# Patient Record
Sex: Female | Born: 1980 | Race: White | Hispanic: No | Marital: Married | Smoking: Never smoker
Health system: Southern US, Community
[De-identification: ages and names within clinical notes are randomized; demographics above are authoritative.]

## PROBLEM LIST (undated history)

## (undated) DIAGNOSIS — I951 Orthostatic hypotension: Secondary | ICD-10-CM

## (undated) DIAGNOSIS — T360X5A Adverse effect of penicillins, initial encounter: Secondary | ICD-10-CM

## (undated) DIAGNOSIS — G90A Postural orthostatic tachycardia syndrome (POTS): Secondary | ICD-10-CM

## (undated) DIAGNOSIS — G629 Polyneuropathy, unspecified: Secondary | ICD-10-CM

## (undated) DIAGNOSIS — D8940 Mast cell activation, unspecified: Secondary | ICD-10-CM

## (undated) DIAGNOSIS — E282 Polycystic ovarian syndrome: Secondary | ICD-10-CM

## (undated) DIAGNOSIS — G43909 Migraine, unspecified, not intractable, without status migrainosus: Secondary | ICD-10-CM

## (undated) DIAGNOSIS — I498 Other specified cardiac arrhythmias: Secondary | ICD-10-CM

---

## 2020-04-20 ENCOUNTER — Emergency Department: Payer: Medicare HMO

## 2020-04-20 ENCOUNTER — Other Ambulatory Visit: Payer: Self-pay

## 2020-04-20 DIAGNOSIS — Z91040 Latex allergy status: Secondary | ICD-10-CM | POA: Insufficient documentation

## 2020-04-20 DIAGNOSIS — Z20822 Contact with and (suspected) exposure to covid-19: Secondary | ICD-10-CM | POA: Insufficient documentation

## 2020-04-20 DIAGNOSIS — Z79899 Other long term (current) drug therapy: Secondary | ICD-10-CM | POA: Insufficient documentation

## 2020-04-20 DIAGNOSIS — M549 Dorsalgia, unspecified: Secondary | ICD-10-CM

## 2020-04-20 DIAGNOSIS — R1013 Epigastric pain: Secondary | ICD-10-CM | POA: Diagnosis present

## 2020-04-20 DIAGNOSIS — K8012 Calculus of gallbladder with acute and chronic cholecystitis without obstruction: Secondary | ICD-10-CM | POA: Diagnosis not present

## 2020-04-20 DIAGNOSIS — R109 Unspecified abdominal pain: Secondary | ICD-10-CM

## 2020-04-20 LAB — COMPREHENSIVE METABOLIC PANEL
ALT: 23 U/L (ref 0–44)
AST: 19 U/L (ref 15–41)
Albumin: 4.4 g/dL (ref 3.5–5.0)
Alkaline Phosphatase: 80 U/L (ref 38–126)
Anion gap: 10 (ref 5–15)
BUN: 9 mg/dL (ref 6–20)
CO2: 25 mmol/L (ref 22–32)
Calcium: 9.4 mg/dL (ref 8.9–10.3)
Chloride: 101 mmol/L (ref 98–111)
Creatinine, Ser: 0.64 mg/dL (ref 0.44–1.00)
GFR, Estimated: >60 mL/min (ref >60)
Glucose, Bld: 129 mg/dL — ABNORMAL HIGH (ref 70–99)
Potassium: 3.6 mmol/L (ref 3.5–5.1)
Sodium: 136 mmol/L (ref 135–145)
Total Bilirubin: 0.5 mg/dL (ref 0.3–1.2)
Total Protein: 7.6 g/dL (ref 6.5–8.1)

## 2020-04-20 LAB — URINALYSIS, COMPLETE (UACMP) WITH MICROSCOPIC
Bacteria, UA: NONE SEEN
Bilirubin Urine: NEGATIVE
Glucose, UA: NEGATIVE mg/dL
Hgb urine dipstick: NEGATIVE
Ketones, ur: NEGATIVE mg/dL
Leukocytes,Ua: NEGATIVE
Nitrite: NEGATIVE
Protein, ur: NEGATIVE mg/dL
Specific Gravity, Urine: 1.016 (ref 1.005–1.030)
pH: 5 (ref 5.0–8.0)

## 2020-04-20 LAB — CBC
HCT: 42.1 % (ref 36.0–46.0)
Hemoglobin: 13.7 g/dL (ref 12.0–15.0)
MCH: 29.3 pg (ref 26.0–34.0)
MCHC: 32.5 g/dL (ref 30.0–36.0)
MCV: 90 fL (ref 80.0–100.0)
Platelets: 362 10*3/uL (ref 150–400)
RBC: 4.68 MIL/uL (ref 3.87–5.11)
RDW: 12.1 % (ref 11.5–15.5)
WBC: 14.3 10*3/uL — ABNORMAL HIGH (ref 4.0–10.5)
nRBC: 0 % (ref 0.0–0.2)

## 2020-04-20 LAB — LIPASE, BLOOD: Lipase: 23 U/L (ref 11–51)

## 2020-04-20 LAB — PREGNANCY, URINE: Preg Test, Ur: NEGATIVE

## 2020-04-20 MED ORDER — OXYCODONE-ACETAMINOPHEN 5-325 MG PO TABS
1.0000 | ORAL_TABLET | Freq: Once | ORAL | Status: AC
  Administered 2020-04-20: 1 via ORAL
  Filled 2020-04-20: qty 1

## 2020-04-20 MED ORDER — ONDANSETRON 4 MG PO TBDP
4.0000 mg | ORAL_TABLET | Freq: Once | ORAL | Status: AC
  Administered 2020-04-20: 4 mg via ORAL
  Filled 2020-04-20: qty 1

## 2020-04-20 NOTE — ED Triage Notes (Signed)
Pt states aprox 2 hours ago she started having back pain that has now moved to her abdomen. Pt states she had 2 episodes of emesis. Denies diarrhea.

## 2020-04-21 ENCOUNTER — Encounter: Payer: Self-pay | Admitting: Radiology

## 2020-04-21 ENCOUNTER — Encounter
Admission: EM | Disposition: A | Discharge status: Home / Self Care | Payer: Self-pay | Source: Home / Self Care | Attending: Emergency Medicine

## 2020-04-21 ENCOUNTER — Observation Stay
Admission: EM | Admit: 2020-04-21 | Discharge: 2020-04-21 | Disposition: A | Discharge status: Home / Self Care | Payer: Medicare HMO | Attending: General Surgery | Admitting: General Surgery

## 2020-04-21 ENCOUNTER — Emergency Department: Payer: Medicare HMO

## 2020-04-21 ENCOUNTER — Observation Stay: Payer: Medicare HMO | Admitting: Anesthesiology

## 2020-04-21 DIAGNOSIS — M549 Dorsalgia, unspecified: Secondary | ICD-10-CM

## 2020-04-21 DIAGNOSIS — K810 Acute cholecystitis: Secondary | ICD-10-CM | POA: Diagnosis present

## 2020-04-21 DIAGNOSIS — R109 Unspecified abdominal pain: Secondary | ICD-10-CM

## 2020-04-21 HISTORY — DX: Polyneuropathy, unspecified: G62.9

## 2020-04-21 HISTORY — DX: Postural orthostatic tachycardia syndrome (POTS): G90.A

## 2020-04-21 HISTORY — DX: Mast cell activation, unspecified: D89.40

## 2020-04-21 HISTORY — DX: Adverse effect of penicillins, initial encounter: T36.0X5A

## 2020-04-21 HISTORY — DX: Polycystic ovarian syndrome: E28.2

## 2020-04-21 HISTORY — DX: Other specified cardiac arrhythmias: I49.8

## 2020-04-21 HISTORY — DX: Migraine, unspecified, not intractable, without status migrainosus: G43.909

## 2020-04-21 HISTORY — DX: Orthostatic hypotension: I95.1

## 2020-04-21 LAB — RESP PANEL BY RT-PCR (FLU A&B, COVID) ARPGX2
Influenza A by PCR: NEGATIVE
Influenza B by PCR: NEGATIVE
SARS Coronavirus 2 by RT PCR: NEGATIVE

## 2020-04-21 SURGERY — CHOLECYSTECTOMY, ROBOT-ASSISTED, LAPAROSCOPIC
Anesthesia: General | Site: Abdomen

## 2020-04-21 MED ORDER — FENTANYL CITRATE (PF) 100 MCG/2ML IJ SOLN
INTRAMUSCULAR | Status: DC | PRN
  Administered 2020-04-21 (×2): 50 ug via INTRAVENOUS

## 2020-04-21 MED ORDER — ONDANSETRON HCL 4 MG/2ML IJ SOLN: 4.0000 mg | Freq: Four times a day (QID) | INTRAMUSCULAR | Status: DC | PRN

## 2020-04-21 MED ORDER — SUGAMMADEX SODIUM 200 MG/2ML IV SOLN
INTRAVENOUS | Status: DC | PRN
  Administered 2020-04-21: 200 mg via INTRAVENOUS

## 2020-04-21 MED ORDER — BUPIVACAINE-EPINEPHRINE 0.25% -1:200000 IJ SOLN
INTRAMUSCULAR | Status: DC | PRN
  Administered 2020-04-21: 17 mL

## 2020-04-21 MED ORDER — PANTOPRAZOLE SODIUM 40 MG IV SOLR
40.0000 mg | Freq: Every day | INTRAVENOUS | Status: DC
  Filled 2020-04-21: qty 40

## 2020-04-21 MED ORDER — HYDROCODONE-ACETAMINOPHEN 5-325 MG PO TABS
ORAL_TABLET | ORAL | Status: AC
  Filled 2020-04-21: qty 2

## 2020-04-21 MED ORDER — ONDANSETRON HCL 4 MG/2ML IJ SOLN
INTRAMUSCULAR | Status: DC | PRN
  Administered 2020-04-21: 4 mg via INTRAVENOUS

## 2020-04-21 MED ORDER — ACETAMINOPHEN 10 MG/ML IV SOLN
INTRAVENOUS | Status: DC | PRN
  Administered 2020-04-21: 1000 mg via INTRAVENOUS

## 2020-04-21 MED ORDER — DEXAMETHASONE SODIUM PHOSPHATE 10 MG/ML IJ SOLN
INTRAMUSCULAR | Status: DC | PRN
  Administered 2020-04-21: 10 mg via INTRAVENOUS

## 2020-04-21 MED ORDER — ONDANSETRON HCL 4 MG/2ML IJ SOLN
4.0000 mg | Freq: Once | INTRAMUSCULAR | Status: AC
  Administered 2020-04-21: 4 mg via INTRAVENOUS
  Filled 2020-04-21: qty 2

## 2020-04-21 MED ORDER — ROCURONIUM BROMIDE 100 MG/10ML IV SOLN
INTRAVENOUS | Status: DC | PRN
  Administered 2020-04-21: 50 mg via INTRAVENOUS

## 2020-04-21 MED ORDER — PYRIDOSTIGMINE BROMIDE 60 MG PO TABS
60.0000 mg | ORAL_TABLET | Freq: Three times a day (TID) | ORAL | Status: DC
  Filled 2020-04-21 (×2): qty 1

## 2020-04-21 MED ORDER — IOHEXOL 300 MG/ML  SOLN
125.0000 mL | Freq: Once | INTRAMUSCULAR | Status: AC | PRN
  Administered 2020-04-21: 125 mL via INTRAVENOUS

## 2020-04-21 MED ORDER — ENOXAPARIN SODIUM 60 MG/0.6ML ~~LOC~~ SOLN
0.5000 mg/kg | SUBCUTANEOUS | Status: DC
  Filled 2020-04-21: qty 0.6

## 2020-04-21 MED ORDER — FENTANYL CITRATE (PF) 100 MCG/2ML IJ SOLN
INTRAMUSCULAR | Status: AC
  Administered 2020-04-21: 25 ug via INTRAVENOUS
  Filled 2020-04-21: qty 2

## 2020-04-21 MED ORDER — DEXMEDETOMIDINE HCL 200 MCG/2ML IV SOLN
INTRAVENOUS | Status: DC | PRN
  Administered 2020-04-21 (×2): 8 ug via INTRAVENOUS
  Administered 2020-04-21: 4 ug via INTRAVENOUS

## 2020-04-21 MED ORDER — DEXMEDETOMIDINE (PRECEDEX) IN NS 20 MCG/5ML (4 MCG/ML) IV SYRINGE
PREFILLED_SYRINGE | INTRAVENOUS | Status: AC
  Filled 2020-04-21: qty 5

## 2020-04-21 MED ORDER — HYDROMORPHONE HCL 1 MG/ML IJ SOLN
INTRAMUSCULAR | Status: AC
  Administered 2020-04-21: 0.5 mg via INTRAVENOUS
  Filled 2020-04-21: qty 1

## 2020-04-21 MED ORDER — SODIUM CHLORIDE 0.9 % IV SOLN
1.0000 g | Freq: Once | INTRAVENOUS | Status: AC
  Administered 2020-04-21: 1 g via INTRAVENOUS
  Filled 2020-04-21: qty 10

## 2020-04-21 MED ORDER — HYDROMORPHONE HCL 1 MG/ML IJ SOLN
0.5000 mg | INTRAMUSCULAR | Status: DC | PRN
  Administered 2020-04-21: 0.5 mg via INTRAVENOUS

## 2020-04-21 MED ORDER — ONDANSETRON 4 MG PO TBDP: 4.0000 mg | ORAL_TABLET | Freq: Four times a day (QID) | ORAL | Status: DC | PRN

## 2020-04-21 MED ORDER — HYDROCODONE-ACETAMINOPHEN 5-325 MG PO TABS: 1.0000 | ORAL_TABLET | ORAL | 0 refills | Status: AC | PRN

## 2020-04-21 MED ORDER — BUPIVACAINE-EPINEPHRINE (PF) 0.25% -1:200000 IJ SOLN
INTRAMUSCULAR | Status: AC
  Filled 2020-04-21: qty 30

## 2020-04-21 MED ORDER — SODIUM CHLORIDE FLUSH 0.9 % IV SOLN
INTRAVENOUS | Status: AC
  Filled 2020-04-21: qty 10

## 2020-04-21 MED ORDER — ACETAMINOPHEN 10 MG/ML IV SOLN
INTRAVENOUS | Status: AC
  Filled 2020-04-21: qty 100

## 2020-04-21 MED ORDER — MIDAZOLAM HCL 2 MG/2ML IJ SOLN
INTRAMUSCULAR | Status: DC | PRN
  Administered 2020-04-21: 2 mg via INTRAVENOUS

## 2020-04-21 MED ORDER — ONDANSETRON HCL 4 MG/2ML IJ SOLN
INTRAMUSCULAR | Status: AC
  Filled 2020-04-21: qty 2

## 2020-04-21 MED ORDER — PROMETHAZINE HCL 25 MG/ML IJ SOLN
INTRAMUSCULAR | Status: AC
  Administered 2020-04-21: 6.25 mg via INTRAVENOUS
  Filled 2020-04-21: qty 1

## 2020-04-21 MED ORDER — ACETAMINOPHEN 325 MG PO TABS: 650.0000 mg | ORAL_TABLET | Freq: Four times a day (QID) | ORAL | Status: DC | PRN

## 2020-04-21 MED ORDER — INDOCYANINE GREEN 25 MG IV SOLR
1.2500 mg | Freq: Once | INTRAVENOUS | Status: AC
  Administered 2020-04-21: 1.25 mg via INTRAVENOUS
  Filled 2020-04-21: qty 0.5

## 2020-04-21 MED ORDER — ACETAMINOPHEN 650 MG RE SUPP: 650.0000 mg | Freq: Four times a day (QID) | RECTAL | Status: DC | PRN

## 2020-04-21 MED ORDER — METRONIDAZOLE IN NACL 5-0.79 MG/ML-% IV SOLN
500.0000 mg | Freq: Once | INTRAVENOUS | Status: AC
  Administered 2020-04-21: 500 mg via INTRAVENOUS
  Filled 2020-04-21: qty 100

## 2020-04-21 MED ORDER — CIPROFLOXACIN IN D5W 400 MG/200ML IV SOLN: 400.0000 mg | Freq: Two times a day (BID) | INTRAVENOUS | Status: DC

## 2020-04-21 MED ORDER — MORPHINE SULFATE (PF) 4 MG/ML IV SOLN
4.0000 mg | INTRAVENOUS | Status: DC | PRN
Start: 2020-04-21 — End: 2020-04-22

## 2020-04-21 MED ORDER — ONDANSETRON HCL 4 MG/2ML IJ SOLN
4.0000 mg | Freq: Once | INTRAMUSCULAR | Status: AC | PRN
  Administered 2020-04-21: 4 mg via INTRAVENOUS

## 2020-04-21 MED ORDER — PHENYLEPHRINE HCL (PRESSORS) 10 MG/ML IV SOLN
INTRAVENOUS | Status: DC | PRN
  Administered 2020-04-21 (×2): 100 ug via INTRAVENOUS

## 2020-04-21 MED ORDER — LIDOCAINE HCL (CARDIAC) PF 100 MG/5ML IV SOSY
PREFILLED_SYRINGE | INTRAVENOUS | Status: DC | PRN
  Administered 2020-04-21: 100 mg via INTRAVENOUS

## 2020-04-21 MED ORDER — MIDAZOLAM HCL 2 MG/2ML IJ SOLN
INTRAMUSCULAR | Status: AC
  Filled 2020-04-21: qty 2

## 2020-04-21 MED ORDER — PROPOFOL 10 MG/ML IV BOLUS
INTRAVENOUS | Status: AC
  Filled 2020-04-21: qty 20

## 2020-04-21 MED ORDER — PROMETHAZINE HCL 25 MG/ML IJ SOLN: 6.2500 mg | INTRAMUSCULAR | Status: DC | PRN

## 2020-04-21 MED ORDER — FENTANYL CITRATE (PF) 100 MCG/2ML IJ SOLN
50.0000 ug | Freq: Once | INTRAMUSCULAR | Status: AC
  Administered 2020-04-21: 50 ug via INTRAVENOUS
  Filled 2020-04-21: qty 2

## 2020-04-21 MED ORDER — LACTATED RINGERS IV SOLN: INTRAVENOUS | Status: DC | PRN

## 2020-04-21 MED ORDER — PROPOFOL 10 MG/ML IV BOLUS
INTRAVENOUS | Status: DC | PRN
  Administered 2020-04-21: 170 mg via INTRAVENOUS

## 2020-04-21 MED ORDER — KETOROLAC TROMETHAMINE 30 MG/ML IJ SOLN
15.0000 mg | Freq: Once | INTRAMUSCULAR | Status: AC
  Administered 2020-04-21: 15 mg via INTRAVENOUS
  Filled 2020-04-21: qty 1

## 2020-04-21 MED ORDER — ENOXAPARIN SODIUM 40 MG/0.4ML ~~LOC~~ SOLN: 40.0000 mg | SUBCUTANEOUS | Status: DC

## 2020-04-21 MED ORDER — FENTANYL CITRATE (PF) 100 MCG/2ML IJ SOLN
INTRAMUSCULAR | Status: AC
  Filled 2020-04-21: qty 2

## 2020-04-21 MED ORDER — HYDROCODONE-ACETAMINOPHEN 5-325 MG PO TABS
1.0000 | ORAL_TABLET | ORAL | Status: DC | PRN
Start: 2020-04-21 — End: 2020-04-22
  Administered 2020-04-21: 2 via ORAL

## 2020-04-21 MED ORDER — VERAPAMIL HCL ER 180 MG PO TBCR
180.0000 mg | EXTENDED_RELEASE_TABLET | Freq: Every day | ORAL | Status: DC
  Filled 2020-04-21: qty 1

## 2020-04-21 MED ORDER — FENTANYL CITRATE (PF) 100 MCG/2ML IJ SOLN
25.0000 ug | INTRAMUSCULAR | Status: AC | PRN
Start: 2020-04-21 — End: 2020-04-21
  Administered 2020-04-21 (×6): 25 ug via INTRAVENOUS

## 2020-04-21 MED ORDER — SODIUM CHLORIDE 0.9 % IV BOLUS
500.0000 mL | Freq: Once | INTRAVENOUS | Status: AC
  Administered 2020-04-21: 500 mL via INTRAVENOUS

## 2020-04-21 SURGICAL SUPPLY — 50 items
BAG INFUSER PRESSURE 100CC (MISCELLANEOUS) IMPLANT
BLADE SURG SZ11 CARB STEEL (BLADE) ×2 IMPLANT
CANISTER SUCT 1200ML W/VALVE (MISCELLANEOUS) IMPLANT
CANNULA REDUC XI 12-8 STAPL (CANNULA) ×1
CANNULA REDUCER 12-8 DVNC XI (CANNULA) ×1 IMPLANT
CHLORAPREP W/TINT 26 (MISCELLANEOUS) ×2 IMPLANT
CLIP VESOLOCK MED LG 6/CT (CLIP) ×2 IMPLANT
COVER WAND RF STERILE (DRAPES) ×2 IMPLANT
DECANTER SPIKE VIAL GLASS SM (MISCELLANEOUS) IMPLANT
DEFOGGER SCOPE WARMER CLEARIFY (MISCELLANEOUS) ×2 IMPLANT
DERMABOND ADVANCED (GAUZE/BANDAGES/DRESSINGS) ×1
DERMABOND ADVANCED .7 DNX12 (GAUZE/BANDAGES/DRESSINGS) ×1 IMPLANT
DRAPE ARM DVNC X/XI (DISPOSABLE) ×4 IMPLANT
DRAPE COLUMN DVNC XI (DISPOSABLE) ×1 IMPLANT
DRAPE DA VINCI XI ARM (DISPOSABLE) ×4
DRAPE DA VINCI XI COLUMN (DISPOSABLE) ×1
ELECT REM PT RETURN 9FT ADLT (ELECTROSURGICAL) ×2
ELECTRODE REM PT RTRN 9FT ADLT (ELECTROSURGICAL) ×1 IMPLANT
GLOVE SURG ENC MOIS LTX SZ6.5 (GLOVE) ×4 IMPLANT
GLOVE SURG UNDER POLY LF SZ6.5 (GLOVE) ×4 IMPLANT
GOWN STRL REUS W/ TWL LRG LVL3 (GOWN DISPOSABLE) ×3 IMPLANT
GOWN STRL REUS W/TWL LRG LVL3 (GOWN DISPOSABLE) ×3
GRASPER SUT TROCAR 14GX15 (MISCELLANEOUS) ×2 IMPLANT
IRRIGATOR SUCT 8 DISP DVNC XI (IRRIGATION / IRRIGATOR) IMPLANT
IRRIGATOR SUCTION 8MM XI DISP (IRRIGATION / IRRIGATOR)
IV NS 1000ML (IV SOLUTION)
IV NS 1000ML BAXH (IV SOLUTION) IMPLANT
KIT PINK PAD W/HEAD ARE REST (MISCELLANEOUS) ×2
KIT PINK PAD W/HEAD ARM REST (MISCELLANEOUS) ×1 IMPLANT
LABEL OR SOLS (LABEL) ×2 IMPLANT
MANIFOLD NEPTUNE II (INSTRUMENTS) IMPLANT
NEEDLE HYPO 22GX1.5 SAFETY (NEEDLE) ×2 IMPLANT
NEEDLE INSUFFLATION 14GA 120MM (NEEDLE) ×2 IMPLANT
NS IRRIG 500ML POUR BTL (IV SOLUTION) ×2 IMPLANT
OBTURATOR OPTICAL STANDARD 8MM (TROCAR) ×1
OBTURATOR OPTICAL STND 8 DVNC (TROCAR) ×1
OBTURATOR OPTICALSTD 8 DVNC (TROCAR) ×1 IMPLANT
PACK LAP CHOLECYSTECTOMY (MISCELLANEOUS) ×2 IMPLANT
POUCH SPECIMEN RETRIEVAL 10MM (ENDOMECHANICALS) ×2 IMPLANT
SEAL CANN UNIV 5-8 DVNC XI (MISCELLANEOUS) ×3 IMPLANT
SEAL XI 5MM-8MM UNIVERSAL (MISCELLANEOUS) ×3
SET TUBE SMOKE EVAC HIGH FLOW (TUBING) ×2 IMPLANT
SOLUTION ELECTROLUBE (MISCELLANEOUS) ×2 IMPLANT
SPONGE LAP 4X18 RFD (DISPOSABLE) ×2 IMPLANT
STAPLER CANNULA SEAL DVNC XI (STAPLE) ×1 IMPLANT
STAPLER CANNULA SEAL XI (STAPLE) ×1
SUT MNCRL 4-0 (SUTURE) ×2
SUT MNCRL 4-0 27XMFL (SUTURE) ×2
SUT VICRYL 0 AB UR-6 (SUTURE) ×2 IMPLANT
SUTURE MNCRL 4-0 27XMF (SUTURE) ×2 IMPLANT

## 2020-04-21 NOTE — Transfer of Care (Signed)
Immediate Anesthesia Transfer of Care Note  Patient: Samantha Baird  Procedure(s) Performed: XI ROBOTIC ASSISTED LAPAROSCOPIC CHOLECYSTECTOMY (N/A Abdomen)  Patient Location: PACU  Anesthesia Type:General  Level of Consciousness: drowsy  Airway & Oxygen Therapy: Patient Spontanous Breathing and Patient connected to face mask oxygen  Post-op Assessment: Report given to RN and Post -op Vital signs reviewed and stable  Post vital signs: Reviewed and stable  Last Vitals:  Vitals Value Taken Time  BP    Temp    Pulse 101 04/21/20 1727  Resp 22 04/21/20 1727  SpO2 97 % 04/21/20 1727  Vitals shown include unvalidated device data.  Last Pain:  Vitals:   04/21/20 1330  TempSrc: Oral  PainSc: 4          Complications: No complications documented.

## 2020-04-21 NOTE — ED Notes (Signed)
ED Provider at bedside. 

## 2020-04-21 NOTE — Anesthesia Procedure Notes (Signed)
Procedure Name: Intubation Date/Time: 04/21/2020 3:58 PM Performed by: Hezzie Bump, CRNA Pre-anesthesia Checklist: Patient identified, Patient being monitored, Timeout performed, Emergency Drugs available and Suction available Patient Re-evaluated:Patient Re-evaluated prior to induction Oxygen Delivery Method: Circle system utilized Preoxygenation: Pre-oxygenation with 100% oxygen Induction Type: IV induction Ventilation: Mask ventilation without difficulty Laryngoscope Size: 3 and McGraph Grade View: Grade I Tube type: Oral Tube size: 7.0 mm Number of attempts: 1 Airway Equipment and Method: Stylet Placement Confirmation: ETT inserted through vocal cords under direct vision,  positive ETCO2 and breath sounds checked- equal and bilateral Secured at: 21 cm Tube secured with: Tape Dental Injury: Teeth and Oropharynx as per pre-operative assessment

## 2020-04-21 NOTE — Discharge Instructions (Signed)
  Diet: Resume home heart healthy regular diet.   Activity: No heavy lifting >20 pounds (children, pets, laundry, garbage) or strenuous activity until follow-up, but light activity and walking are encouraged. Do not drive or drink alcohol if taking narcotic pain medications.  Wound care: May shower with soapy water and pat dry (do not rub incisions), but no baths or submerging incision underwater until follow-up. (no swimming)   Medications: Resume all home medications. For mild to moderate pain: acetaminophen (Tylenol) or ibuprofen (if no kidney disease). Combining Tylenol with alcohol can substantially increase your risk of causing liver disease. Narcotic pain medications, if prescribed, can be used for severe pain, though may cause nausea, constipation, and drowsiness. Do not combine Tylenol and Norco within a 6 hour period as Norco contains Tylenol. If you do not need the narcotic pain medication, you do not need to fill the prescription.  Call office ([phone]) at any time if any questions, worsening pain, fevers/chills, bleeding, drainage from incision site, or other concerns.   AMBULATORY SURGERY  DISCHARGE INSTRUCTIONS   1) The drugs that you were given will stay in your system until tomorrow so for the next 24 hours you should not:  A) Drive an automobile B) Make any legal decisions C) Drink any alcoholic beverage   2) You may resume regular meals tomorrow.  Today it is better to start with liquids and gradually work up to solid foods.  You may eat anything you prefer, but it is better to start with liquids, then soup and crackers, and gradually work up to solid foods.   3) Please notify your doctor immediately if you have any unusual bleeding, trouble breathing, redness and pain at the surgery site, drainage, fever, or pain not relieved by medication.  4) Your post-operative visit with Dr.                                     is: Date:                        Time:     Please call to schedule your post-operative visit.  5) Additional Instructions:   

## 2020-04-21 NOTE — Anesthesia Postprocedure Evaluation (Signed)
Anesthesia Post Note  Patient: Samantha Baird  Procedure(s) Performed: XI ROBOTIC ASSISTED LAPAROSCOPIC CHOLECYSTECTOMY (N/A Abdomen)  Patient location during evaluation: PACU Anesthesia Type: General Level of consciousness: awake and alert Pain management: pain level controlled Vital Signs Assessment: post-procedure vital signs reviewed and stable Respiratory status: spontaneous breathing, nonlabored ventilation, respiratory function stable and patient connected to nasal cannula oxygen Cardiovascular status: blood pressure returned to baseline and stable Postop Assessment: no apparent nausea or vomiting Anesthetic complications: no   No complications documented.   Last Vitals:  Vitals:   04/21/20 1830 04/21/20 1845  BP: 130/77 (!) 108/58  Pulse: 80 87  Resp: 12 19  Temp:    SpO2: 93% 97%    Last Pain:  Vitals:   04/21/20 1921  TempSrc:   PainSc: 5                  Cleda Mccreedy Ambera Fedele

## 2020-04-21 NOTE — ED Provider Notes (Signed)
[facility] - Inpatient Emergency Department Provider Note  ____________________________________________  Time seen: Approximately 2:57 AM  I have reviewed the triage vital signs and the nursing notes.   HISTORY  Chief Complaint Back Pain and Abdominal Pain   HPI Samantha Baird is a 40 y.o. female with a history of PCOS, chronic back pain, mast cell activation syndrome, migraine, POTS who presents for evaluation of abdominal pain.  Pain started 2 hours prior to arrival.  Initially was epigastric but then became diffuse across her abdomen and radiating to her back.  The pain exacerbates her chronic low back pain.  She has had nausea and 3 episodes of nonbloody nonbilious emesis.  The pain initially was 9 out of 10 but after receiving oxycodone in triage her pain now is moderate in intensity.  The pain has been constant for several hours.  No prior abdominal surgeries or similar pain.  No diarrhea or constipation.  She did notice a foul-smelling urine couple days ago but this seems to have resolved.  She denies dysuria or hematuria, history of UTI/pyelonephritis/kidney stones.  She denies any history of STDs and reports being with the same partner for the last 10 years, denies any vaginal discharge.   Past Medical History:  Diagnosis Date  . Mast cell activation syndrome (HCC)   . Migraine   . Orthostatic hypotension   . PCOS (polycystic ovarian syndrome)   . Penicillin adverse reaction   . POTS (postural orthostatic tachycardia syndrome)   . Small fiber neuropathy     There are no problems to display for this patient.   History reviewed. No pertinent surgical history.  Prior to Admission medications   Not on File    Allergies Latex and Penicillins  No family history on file.  Social History Social History   Tobacco Use  . Smoking status: Never Smoker  . Smokeless tobacco: Never Used  Vaping Use  . Vaping Use: Never used  Substance Use Topics  . Alcohol  use: Not Currently  . Drug use: Never    Review of Systems  Constitutional: Negative for fever. Eyes: Negative for visual changes. ENT: Negative for sore throat. Neck: No neck pain  Cardiovascular: Negative for chest pain. Respiratory: Negative for shortness of breath. Gastrointestinal: + diffuse abdominal pain, nausea, and vomiting. No diarrhea. Genitourinary: Negative for dysuria. Musculoskeletal: Negative for back pain. Skin: Negative for rash. Neurological: Negative for headaches, weakness or numbness. Psych: No SI or HI  ____________________________________________   PHYSICAL EXAM:  VITAL SIGNS: ED Triage Vitals  Enc Vitals Group     BP 04/20/20 2145 129/78     Pulse Rate 04/20/20 2145 83     Resp 04/20/20 2145 18     Temp 04/20/20 2145 (!) 97.5 F (36.4 C)     Temp Source 04/20/20 2145 Oral     SpO2 04/20/20 2145 99 %     Weight 04/20/20 2140 230 lb (104.3 kg)     Height 04/20/20 2140 5\' 2"  (1.575 m)     Head Circumference --      Peak Flow --      Pain Score 04/20/20 2140 8     Pain Loc --      Pain Edu? --      Excl. in GC? --     Constitutional: Alert and oriented. Well appearing and in no apparent distress. HEENT:      Head: Normocephalic and atraumatic.         Eyes: Conjunctivae are  normal. Sclera is non-icteric.       Mouth/Throat: Mucous membranes are moist.       Neck: Supple with no signs of meningismus. Cardiovascular: Regular rate and rhythm. No murmurs, gallops, or rubs. 2+ symmetrical distal pulses are present in all extremities. No JVD. Respiratory: Normal respiratory effort. Lungs are clear to auscultation bilaterally.  Gastrointestinal: Soft, tender to palpation on the RLQ, RUQ, and suprapubic regions, negative Murphy's sign, non distended with positive bowel sounds. No rebound or guarding. Genitourinary: No CVA tenderness. Musculoskeletal:  No edema, cyanosis, or erythema of extremities. Neurologic: Normal speech and language. Face is  symmetric. Moving all extremities. No gross focal neurologic deficits are appreciated. Skin: Skin is warm, dry and intact. No rash noted. Psychiatric: Mood and affect are normal. Speech and behavior are normal.  ____________________________________________   LABS (all labs ordered are listed, but only abnormal results are displayed)  Labs Reviewed  COMPREHENSIVE METABOLIC PANEL - Abnormal; Notable for the following components:      Result Value   Glucose, Bld 129 (*)    All other components within normal limits  CBC - Abnormal; Notable for the following components:   WBC 14.3 (*)    All other components within normal limits  URINALYSIS, COMPLETE (UACMP) WITH MICROSCOPIC - Abnormal; Notable for the following components:   Color, Urine YELLOW (*)    APPearance HAZY (*)    All other components within normal limits  LIPASE, BLOOD  PREGNANCY, URINE   ____________________________________________  EKG  none  ____________________________________________  RADIOLOGY  I have personally reviewed the images performed during this visit and I agree with the Radiologist's read.   Interpretation by Radiologist:  CT ABDOMEN PELVIS W CONTRAST  Result Date: 04/21/2020 CLINICAL DATA:  Unspecified abdominal pain EXAM: CT ABDOMEN AND PELVIS WITH CONTRAST TECHNIQUE: Multidetector CT imaging of the abdomen and pelvis was performed using the standard protocol following bolus administration of intravenous contrast. CONTRAST:  OMNIPAQUE IOHEXOL 300 MG/ML  SOLN COMPARISON:  None. FINDINGS: Lower chest: The visualized lung bases are clear bilaterally. The visualized heart and pericardium are unremarkable Hepatobiliary: Cholelithiasis. There is subtle pericholecystic inflammatory stranding best seen in the region of the gallbladder neck on coronal and sagittal imaging most in keeping with changes of early acute cholecystitis. The gallbladder is not distended and demonstrates uniform enhancement. No  intra or extrahepatic biliary ductal dilation. Liver is unremarkable. Pancreas: Unremarkable. No pancreatic ductal dilatation or surrounding inflammatory changes. Spleen: Unremarkable Adrenals/Urinary Tract: Adrenal glands are unremarkable. Kidneys are normal, without renal calculi, focal lesion, or hydronephrosis. Bladder is unremarkable. Stomach/Bowel: Stomach is within normal limits. Appendix appears normal. No evidence of bowel wall thickening, distention, or inflammatory changes. No free intraperitoneal gas or fluid. Vascular/Lymphatic: No significant vascular findings are present. No enlarged abdominal or pelvic lymph nodes. Reproductive: Intrauterine device seen within the uterine cavity in expected position. The pelvic organs are otherwise unremarkable. Other: Tiny fat containing umbilical hernia.  Rectum unremarkable. Musculoskeletal: No acute bone abnormality. No lytic or blastic bone lesion. IMPRESSION: Cholelithiasis with subtle pericholecystic inflammatory changes in keeping with early, acute cholecystitis. Electronically Signed   By: Helyn Numbers MD   On: 04/21/2020 03:56   US PELVIC COMPLETE W TRANSVAGINAL AND TORSION R/O  Result Date: 04/21/2020 CLINICAL DATA:  Initial evaluation for acute lower abdominal pain with back pain. EXAM: TRANSABDOMINAL AND TRANSVAGINAL ULTRASOUND OF PELVIS DOPPLER ULTRASOUND OF OVARIES TECHNIQUE: Both transabdominal and transvaginal ultrasound examinations of the pelvis were performed. Transabdominal technique was performed for  global imaging of the pelvis including uterus, ovaries, adnexal regions, and pelvic cul-de-sac. It was necessary to proceed with endovaginal exam following the transabdominal exam to visualize the uterus, endometrium, and ovaries. Color and duplex Doppler ultrasound was utilized to evaluate blood flow to the ovaries. COMPARISON:  None. FINDINGS: Uterus Measurements: 6.0 x 2.5 x 3.8 cm = volume: 29.4 mL. Uterus is anteverted. 1.4 x 0.9 x 1.2 cm  subserosal fibroid present at the right uterine body. Endometrium Thickness: 3.3 mm. No focal abnormality visualized. IUD in appropriate position within the medial cavity. Small volume simple fluid noted within the endocervical canal. Right ovary Measurements: 3.1 x 1.6 x 1.7 cm = volume: 2.7 mL. Normal appearance/no adnexal mass. Left ovary Measurements: 4.7 x 2.0 x 2.5 cm = volume: 12.4 mL. 2.3 x 1.6 x 1.9 cm complex cyst with internal lace-like architecture, most characteristic of a benign hemorrhagic cyst. Pulsed Doppler evaluation of both ovaries demonstrates normal low-resistance arterial and venous waveforms. Other findings Trace free fluid seen within the pelvis. IMPRESSION: 1. 2.3 cm complex left ovarian cyst, most characteristic of a benign hemorrhagic cyst. 2. No evidence for ovarian torsion. 3. IUD in appropriate position within the endometrial cavity. 4. 1.4 cm subserosal fibroid at the right uterine body. 5. No other acute abnormality. Electronically Signed   By: Rise Mu M.D.   On: 04/21/2020 01:02     ____________________________________________   PROCEDURES  Procedure(s) performed: None Procedures Critical Care performed:  None ____________________________________________   INITIAL IMPRESSION / ASSESSMENT AND PLAN / ED COURSE  40 y.o. female with a history of PCOS, chronic back pain, mast cell activation syndrome, migraine, POTS who presents for evaluation of abdominal pain.  Patient is well-appearing in no distress with normal vital signs, abdomen is soft with mild tenderness to palpation the RLQ, RUQ, and suprapubic regions with no rebound or guarding.  No distention and positive bowel sounds.    Ddx UTI, pyelonephritis, kidney stone, appendicitis, diverticulitis, GB pathology, gastritis, gastroenteritis, pregnancy, ectopic, ovarian torsion, PID, STD.   Labs showing elevated white count of 14.3 which according to the patient is chronic for her.  She reports that  her white count is always elevated.  She has normal LFTs and lipase, her urine shows no signs of an infection or blood, pregnancy test is negative.  Mildly elevated blood glucose of 129 which was discussed with patient.  No history of diabetes.  Patient was sent for an ultrasound from the waiting room which shows a 2.3 cm left hemorrhagic ovarian cyst but since patient is mostly tender on the right lower quadrant with an elevated white count we will pursue a CT.  Will give a dose of IV Toradol for pain.  _________________________ 7:19 AM on 04/21/2020 -----------------------------------------  CT concerning for possible borderline/early acute cholecystitis.  Recommending ultrasound of the right upper quadrant which has been done.  Unfortunately radiology system PACS is down images are not available to be evaluated by me or the radiologist at this time.  Patient was complaining of recurrent pain and received a dose of IV fentanyl.  Plan to reassess once the results of the ultrasound are available and consult surgery.  Care transferred to Dr. Roxan Hockey.    _____________________________________________ Please note:  Patient was evaluated in Emergency Department today for the symptoms described in the history of present illness. Patient was evaluated in the context of the global COVID-19 pandemic, which necessitated consideration that the patient might be at risk for infection with the SARS-CoV-2 virus  that causes COVID-19. Institutional protocols and algorithms that pertain to the evaluation of patients at risk for COVID-19 are in a state of rapid change based on information released by regulatory bodies including the CDC and federal and state organizations. These policies and algorithms were followed during the patient's care in the ED.  Some ED evaluations and interventions may be delayed as a result of limited staffing during the pandemic.   [place] Controlled Substance Database was reviewed by  me. ____________________________________________   FINAL CLINICAL IMPRESSION(S) / ED DIAGNOSES   Final diagnoses:  Back pain  Abdominal pain      NEW MEDICATIONS STARTED DURING THIS VISIT:  ED Discharge Orders    None       Note:  This document was prepared using Dragon voice recognition software and may include unintentional dictation errors.    Don PerkingVeronese, [place], MD 04/21/20 [phone]

## 2020-04-21 NOTE — H&P (Signed)
SURGICAL CONSULTATION NOTE   HISTORY OF PRESENT ILLNESS (HPI):  40 y.o. female presented to [facility] ED for evaluation of abdominal pain since last night.  Patient reports started with pain on the epigastric area after eating. Pain radiated to her back. Pain aggravated by eating. Alleviating factor was IV pain medications.   At the ED she was found with leukocytosis. CT of the abdomen showed changes of cholecystitis. Abdominal ultrasound shows sludge. I personally evaluated the images  Surgery is consulted by Dr. Roxan Hockey in this context for evaluation and management of acute cholecystitis.  PAST MEDICAL HISTORY (PMH):  Past Medical History:  Diagnosis Date  . Mast cell activation syndrome (HCC)   . Migraine   . Orthostatic hypotension   . PCOS (polycystic ovarian syndrome)   . Penicillin adverse reaction   . POTS (postural orthostatic tachycardia syndrome)   . Small fiber neuropathy      PAST SURGICAL HISTORY (PSH):  History reviewed. No pertinent surgical history.   MEDICATIONS:  Prior to Admission medications   Medication Sig Start Date End Date Taking? Authorizing Provider  Betahistine HCl POWD 24 mg by Does not apply route 2 (two) times daily.   Yes [provider]  cholecalciferol (VITAMIN D3) 25 MCG (1000 UNIT) tablet Take 2,000 Units by mouth 3 (three) times a week.   Yes [provider]  diphenhydrAMINE (BENADRYL) 25 mg capsule Take 25 mg by mouth every 6 (six) hours as needed.   Yes [provider]  Erenumab-aooe (AIMOVIG) 140 MG/ML SOAJ Inject into the skin as needed.   Yes [provider]  famotidine (PEPCID) 20 MG tablet Take 20 mg by mouth daily.   Yes [provider]  fexofenadine (ALLEGRA) 180 MG tablet Take 180 mg by mouth daily.   Yes [provider]  gabapentin (NEURONTIN) 800 MG tablet Take 800 mg by mouth 2 (two) times daily.   Yes [provider]  lamoTRIgine (LAMICTAL) 100 MG tablet Take 100 mg by  mouth at bedtime.   Yes [provider]  midodrine (PROAMATINE) 10 MG tablet Take 10 mg by mouth 3 (three) times daily.   Yes [provider]  modafinil (PROVIGIL) 200 MG tablet Take 200 mg by mouth daily. 12/24/19 12/23/20 Yes [provider]  Multiple Vitamin (MULTIVITAMIN WITH MINERALS) TABS tablet Take 1 tablet by mouth daily.   Yes [provider]  NALTREXONE HCL PO Take 4.5 mg by mouth in the morning and at bedtime.   Yes [provider]  ondansetron (ZOFRAN) 8 MG tablet Take 8 mg by mouth every 8 (eight) hours as needed for nausea or vomiting.   Yes [provider]  pyridostigmine (MESTINON) 60 MG tablet Take 60 mg by mouth 3 (three) times daily. 04/05/20  Yes [provider]  tizanidine (ZANAFLEX) 2 MG capsule Take 2 mg by mouth 3 (three) times daily as needed for muscle spasms.   Yes [provider]  verapamil (CALAN) 40 MG tablet Take 40 mg by mouth daily. lunch   Yes [provider]  verapamil (CALAN-SR) 180 MG CR tablet Take 180 mg by mouth at bedtime. 03/23/20  Yes [provider]     ALLERGIES:  Allergies  Allergen Reactions  . Amitriptyline Other (See Comments)    HALLUCINATIONS  . Latex   . Penicillins Hives     SOCIAL HISTORY:  Social History   Socioeconomic History  . Marital status: Married    Spouse name: Not on file  .  Number of children: Not on file  . Years of education: Not on file  . Highest education level: Not on file  Occupational History  . Not on file  Tobacco Use  . Smoking status: Never Smoker  . Smokeless tobacco: Never Used  Vaping Use  . Vaping Use: Never used  Substance and Sexual Activity  . Alcohol use: Not Currently  . Drug use: Never  . Sexual activity: Not on file  Other Topics Concern  . Not on file  Social History Narrative  . Not on file   Social Determinants of Health   Financial Resource Strain: Not on file  Food Insecurity: Not on file   Transportation Needs: Not on file  Physical Activity: Not on file  Stress: Not on file  Social Connections: Not on file  Intimate Partner Violence: Not on file     FAMILY HISTORY:  No family history on file.   REVIEW OF SYSTEMS:  Constitutional: denies weight loss, fever, chills, or sweats  Eyes: denies any other vision changes, history of eye injury  ENT: denies sore throat, hearing problems  Respiratory: denies shortness of breath, wheezing  Cardiovascular: denies chest pain, palpitations  Gastrointestinal: positive abdominal pain, Denies nausea and vomitnig Genitourinary: denies burning with urination or urinary frequency Musculoskeletal: denies any other joint pains or cramps  Skin: denies any other rashes or skin discolorations  Neurological: denies any other headache, dizziness, weakness  Psychiatric: denies any other depression, anxiety   All other review of systems were negative   VITAL SIGNS:  Temp:  [97.5 F (36.4 C)] 97.5 F (36.4 C) (03/02 2145) Pulse Rate:  [83-106] 96 (03/03 1205) Resp:  [16-18] 16 (03/03 1205) BP: (109-135)/(54-84) 126/72 (03/03 1205) SpO2:  [97 %-100 %] 100 % (03/03 1205) Weight:  [104.3 kg] 104.3 kg (03/02 2140)     Height: 5\' 2"  (157.5 cm) Weight: 104.3 kg BMI (Calculated): 42.06   INTAKE/OUTPUT:  This shift: No intake/output data recorded.  Last 2 shifts: @IOLAST2SHIFTS @   PHYSICAL EXAM:  Constitutional:  -- Normal body habitus  -- Awake, alert, and oriented x3  Eyes:  -- Pupils equally round and reactive to light  -- No scleral icterus  Ear, nose, and throat:  -- No jugular venous distension  Pulmonary:  -- No crackles  -- Equal breath sounds bilaterally -- Breathing non-labored at rest Cardiovascular:  -- S1, S2 present  -- No pericardial rubs Gastrointestinal:  -- Abdomen soft, tender in the right upper quadrant, non-distended, no guarding or rebound tenderness -- No abdominal masses appreciated, pulsatile or otherwise   Musculoskeletal and Integumentary:  -- Wounds: None appreciated -- Extremities: B/L UE and LE FROM, hands and feet warm, no edema  Neurologic:  -- Motor function: intact and symmetric -- Sensation: intact and symmetric   Labs:  CBC Latest Ref Rng & Units 04/20/2020  WBC 4.0 - 10.5 K/uL 14.3(H)  Hemoglobin 12.0 - 15.0 g/dL  Hematocrit 06/20/2020 - 46.0 % 42.1  Platelets 150 - 400 K/uL 362   CMP Latest Ref Rng & Units 04/20/2020  Glucose 70 - 99 mg/dL 93.2)  BUN 6 - 20 mg/dL 9  Creatinine 06/20/2020 - 671(I mg/dL 4.58  Sodium 0.99 - 8.33 mmol/L 136  Potassium 3.5 - 5.1 mmol/L 3.6  Chloride 98 - 111 mmol/L 101  CO2 22 - 32 mmol/L 25  Calcium 8.9 - 10.3 mg/dL 9.4  Total Protein 6.5 - 8.1 g/dL 7.6  Total Bilirubin 0.3 - 1.2 mg/dL 0.5  Alkaline  Phos 38 - 126 U/L 80  AST 15 - 41 U/L 19  ALT 0 - 44 U/L 23     Imaging studies:  EXAM: CT ABDOMEN AND PELVIS WITH CONTRAST  TECHNIQUE: Multidetector CT imaging of the abdomen and pelvis was performed using the standard protocol following bolus administration of intravenous contrast.  CONTRAST:  OMNIPAQUE IOHEXOL 300 MG/ML  SOLN  COMPARISON:  None.  FINDINGS: Lower chest: The visualized lung bases are clear bilaterally. The visualized heart and pericardium are unremarkable  Hepatobiliary: Cholelithiasis. There is subtle pericholecystic inflammatory stranding best seen in the region of the gallbladder neck on coronal and sagittal imaging most in keeping with changes of early acute cholecystitis. The gallbladder is not distended and demonstrates uniform enhancement. No intra or extrahepatic biliary ductal dilation. Liver is unremarkable.  Pancreas: Unremarkable. No pancreatic ductal dilatation or surrounding inflammatory changes.  Spleen: Unremarkable  Adrenals/Urinary Tract: Adrenal glands are unremarkable. Kidneys are normal, without renal calculi, focal lesion, or hydronephrosis. Bladder is  unremarkable.  Stomach/Bowel: Stomach is within normal limits. Appendix appears normal. No evidence of bowel wall thickening, distention, or inflammatory changes. No free intraperitoneal gas or fluid.  Vascular/Lymphatic: No significant vascular findings are present. No enlarged abdominal or pelvic lymph nodes.  Reproductive: Intrauterine device seen within the uterine cavity in expected position. The pelvic organs are otherwise unremarkable.  Other: Tiny fat containing umbilical hernia.  Rectum unremarkable.  Musculoskeletal: No acute bone abnormality. No lytic or blastic bone lesion.  IMPRESSION: Cholelithiasis with subtle pericholecystic inflammatory changes in keeping with early, acute cholecystitis.   Electronically Signed   By: Helyn Numbers MD   On: 04/21/2020 03:56   Assessment/Plan:  40 y.o. female with acute cholecystitis.  Patient with history, physical exam and images consistent with acute cholecystitis. Patient oriented about diagnosis and surgical management as treatment.   Discussed the risk of surgery including post-op infxn, seroma, biloma, chronic pain, poor-delayed wound healing, retained gallstone, conversion to open procedure, post-op SBO or ileus, and need for additional procedures to address said risks.  The risks of general anesthetic including MI, CVA, sudden death or even reaction to anesthetic medications also discussed. Alternatives include continued observation.  Benefits include possible symptom relief, prevention of complications including acute cholecystitis, pancreatitis.  Gae Gallop, MD

## 2020-04-21 NOTE — Anesthesia Preprocedure Evaluation (Addendum)
Anesthesia Evaluation  Patient identified by MRN, date of birth, ID band Patient awake    Reviewed: Allergy & Precautions, Patient's Chart, lab work & pertinent test results  Airway Mallampati: III  TM Distance: >3 FB     Dental  (+) Teeth Intact, Caps   Pulmonary neg pulmonary ROS,    Pulmonary exam normal        Cardiovascular Normal cardiovascular exam  POTS   Neuro/Psych  Headaches,  Neuromuscular disease negative psych ROS   GI/Hepatic Neg liver ROS,   Endo/Other  negative endocrine ROS  Renal/GU negative Renal ROS  Female GU complaint     Musculoskeletal  (+) Fibromyalgia -  Abdominal (+) + obese,   Peds negative pediatric ROS (+)  Hematology negative hematology ROS (+)   Anesthesia Other Findings Past Medical History: No date: Mast cell activation syndrome (HCC) No date: Migraine No date: Orthostatic hypotension No date: PCOS (polycystic ovarian syndrome) No date: Penicillin adverse reaction No date: POTS (postural orthostatic tachycardia syndrome) No date: Small fiber neuropathy Myofascial pain syndrome Patient states that she gets transient stroke like symptoms with her migraines and is followed by neurology who state that it is not TIA in nature.  Reproductive/Obstetrics                          Anesthesia Physical Anesthesia Plan  ASA: III  Anesthesia Plan: General   Post-op Pain Management:    Induction: Intravenous  PONV Risk Score and Plan:   Airway Management Planned: Oral ETT and Video Laryngoscope Planned  Additional Equipment:   Intra-op Plan:   Post-operative Plan: Extubation in OR  Informed Consent: I have reviewed the patients History and Physical, chart, labs and discussed the procedure including the risks, benefits and alternatives for the proposed anesthesia with the patient or authorized representative who has indicated his/her understanding and  acceptance.     Dental advisory given  Plan Discussed with: CRNA and Surgeon  Anesthesia Plan Comments: (Discuused all risks in the anesthesia consent and patient voiced understanding and wishes to proceed with the surgery.)        Anesthesia Quick Evaluation

## 2020-04-21 NOTE — Discharge Summary (Signed)
  Patient ID: Samantha Baird MRN: [id number] DOB/AGE: 05/04/1980 40 y.o.  Admit date: 04/21/2020 Discharge date: 04/21/2020   Discharge Diagnoses:  Active Problems:   Acute cholecystitis   Procedures: Robotic assisted laparoscopic cholecystectomy  Hospital Course: Patient with acute cholecystitis.  She underwent robotic assisted laparoscopic cholecystectomy.  Surgery was uneventful.  Patient in postop repair was be able to get under control with pain medications.  She was able to tolerate fluids.  Patient is alert and cooperative and wanted to go home.  Wounds are dry and clean.  Consults: None  Disposition: Discharge disposition: 01-Home or Self Care       Discharge Instructions    Diet - low sodium heart healthy   Complete by: As directed    Increase activity slowly   Complete by: As directed      Allergies as of 04/21/2020      Reactions   Amitriptyline Other (See Comments)   HALLUCINATIONS   Latex    Penicillins Hives      Medication List    TAKE these medications   Aimovig 140 MG/ML Soaj Generic drug: Erenumab-aooe Inject into the skin as needed.   Betahistine HCl Powd 24 mg by Does not apply route 2 (two) times daily.   cholecalciferol 25 MCG (1000 UNIT) tablet Commonly known as: VITAMIN D3 Take 2,000 Units by mouth 3 (three) times a week.   diphenhydrAMINE 25 mg capsule Commonly known as: BENADRYL Take 25 mg by mouth every 6 (six) hours as needed.   famotidine 20 MG tablet Commonly known as: PEPCID Take 20 mg by mouth daily.   fexofenadine 180 MG tablet Commonly known as: ALLEGRA Take 180 mg by mouth daily.   gabapentin 800 MG tablet Commonly known as: NEURONTIN Take 800 mg by mouth 2 (two) times daily.   HYDROcodone-acetaminophen 5-325 MG tablet Commonly known as: Norco Take 1 tablet by mouth every 4 (four) hours as needed for up to 3 days for moderate pain.   lamoTRIgine 100 MG tablet Commonly known as: LAMICTAL Take 100 mg by mouth at  bedtime.   midodrine 10 MG tablet Commonly known as: PROAMATINE Take 10 mg by mouth 3 (three) times daily.   modafinil 200 MG tablet Commonly known as: PROVIGIL Take 200 mg by mouth daily.   multivitamin with minerals Tabs tablet Take 1 tablet by mouth daily.   NALTREXONE HCL PO Take 4.5 mg by mouth in the morning and at bedtime.   ondansetron 8 MG tablet Commonly known as: ZOFRAN Take 8 mg by mouth every 8 (eight) hours as needed for nausea or vomiting.   pyridostigmine 60 MG tablet Commonly known as: MESTINON Take 60 mg by mouth 3 (three) times daily.   tizanidine 2 MG capsule Commonly known as: ZANAFLEX Take 2 mg by mouth 3 (three) times daily as needed for muscle spasms.   verapamil 40 MG tablet Commonly known as: CALAN Take 40 mg by mouth daily. lunch   verapamil 180 MG CR tablet Commonly known as: CALAN-SR Take 180 mg by mouth at bedtime.       Follow-up Information    Carolan Shiver, MD Follow up in 2 week(s).   Specialty: General Surgery Why: Please call the office in the morning to schedule your follow up Contact information: [address] [place] [address] [phone]

## 2020-04-21 NOTE — Op Note (Signed)
Preoperative diagnosis: Acute cholecystitis  Postoperative diagnosis: Acute cholecystitis  Procedure: Robotic Assisted Laparoscopic Cholecystectomy.   Anesthesia: GETA   Surgeon: Dr. Hazle Quant  Wound Classification: Clean Contaminated  Indications: Patient is a 40 y.o. female developed right upper quadrant pain and leukocytosis and on workup was found to have cholelithiasis with a normal common duct and cholecystitis. Robotic Assisted Laparoscopic cholecystectomy was elected.  Findings: Acutely inflamed gallbladder.  Critical view of safety achieved Cystic duct and artery identified, ligated and divided Adequate hemostasis  Description of procedure: The patient was placed on the operating table in the supine position. General anesthesia was induced. A time-out was completed verifying correct patient, procedure, site, positioning, and implant(s) and/or special equipment prior to beginning this procedure. An orogastric tube was placed. The abdomen was prepped and draped in the usual sterile fashion.  An incision was made in a natural skin line below the umbilicus.  The fascia was elevated and the Veress needle inserted. Proper position was confirmed by aspiration and saline meniscus test.  The abdomen was insufflated with carbon dioxide to a pressure of 15 mmHg. The patient tolerated insufflation well. A 8-mm trocar was then inserted in optiview fashion.  The laparoscope was inserted and the abdomen inspected. No injuries from initial trocar placement were noted. Additional trocars were then inserted in the following locations: an 8-mm trocar in the left lateral abdomen, and another two 8-mm trocars to the right side of the abdomen 5 cm appart. The umbilical trocar was changed to a 12 mm trocar all under direct visualization. The abdomen was inspected and no abnormalities were found. The table was placed in the reverse Trendelenburg position with the right side up. The robotic arms were  docked and target anatomy identified. Instrument inserted under direct visualization.  Filmy adhesions between the gallbladder and omentum, duodenum and transverse colon were lysed with electrocautery. The dome of the gallbladder was grasped with a prograsp and retracted over the dome of the liver. The infundibulum was also grasped with an atraumatic grasper and retracted toward the right lower quadrant. This maneuver exposed Calot's triangle. The peritoneum overlying the gallbladder infundibulum was then incised and the cystic duct and cystic artery identified and circumferentially dissected. Critical view of safety reviewed before ligating any structure. Firefly images taken to visualize biliary ducts. The cystic duct and cystic artery were then doubly clipped and divided close to the gallbladder.  The gallbladder was then dissected from its peritoneal attachments by electrocautery. Hemostasis was checked and the gallbladder and contained stones were removed using an endoscopic retrieval bag. The gallbladder was passed off the table as a specimen. The gallbladder fossa was copiously irrigated with saline and hemostasis was obtained. There was no evidence of bleeding from the gallbladder fossa or cystic artery or leakage of the bile from the cystic duct stump. Secondary trocars were removed under direct vision. No bleeding was noted. The robotic arms were undoked. The scope was withdrawn and the umbilical trocar removed. The abdomen was allowed to collapse. The fascia of the 71mm trocar sites was closed with figure-of-eight 0 vicryl sutures. The skin was closed with subcuticular sutures of 4-0 monocryl and topical skin adhesive. The orogastric tube was removed.  The patient tolerated the procedure well and was taken to the postanesthesia care unit in stable condition.   Specimen: Gallbladder  Complications: None  EBL: 10 mL

## 2020-04-21 NOTE — Progress Notes (Signed)
PHARMACIST - PHYSICIAN COMMUNICATION  CONCERNING:  Enoxaparin (Lovenox) for DVT Prophylaxis    RECOMMENDATION: Patient was prescribed enoxaprin 40mg  q24 hours for VTE prophylaxis.   Filed Weights   04/20/20 2140  Weight: 104.3 kg (230 lb)    Body mass index is 42.07 kg/m.  Estimated Creatinine Clearance: 107 mL/min (by C-G formula based on SCr of 0.64 mg/dL).   Based on [facility] policy patient is candidate for enoxaparin 0.5mg /kg TBW SQ every 24 hours based on BMI being >30.  DESCRIPTION: Pharmacy has adjusted enoxaparin dose per [facility] policy.  Patient is now receiving enoxaparin 52.5 mg every 24 hours    [facility], PharmD Clinical Pharmacist  04/21/2020 11:25 AM

## 2020-04-21 NOTE — ED Notes (Signed)
Pt reports she has not taken her verapamil today, states HR >100 at baseline when she does not take it.

## 2020-04-21 NOTE — ED Notes (Signed)
US at bedside

## 2020-04-25 LAB — SURGICAL PATHOLOGY

## 2022-02-12 IMAGING — US US PELVIS COMPLETE TRANSABD/TRANSVAG W DUPLEX
1 series · 13 of 25 positions shown · non-contrast
Comparison: None.

CLINICAL DATA: Initial evaluation for acute lower abdominal pain
with back pain.

EXAM:
TRANSABDOMINAL AND TRANSVAGINAL ULTRASOUND OF PELVIS
DOPPLER ULTRASOUND OF OVARIES
TECHNIQUE: Both transabdominal and transvaginal ultrasound examinations of the
pelvis were performed. Transabdominal technique was performed for
global imaging of the pelvis including uterus, ovaries, adnexal
regions, and pelvic cul-de-sac.
It was necessary to proceed with endovaginal exam following the
transabdominal exam to visualize the uterus, endometrium, and
ovaries. Color and duplex Doppler ultrasound was utilized to
evaluate blood flow to the ovaries.

[Series 1: us pelvic complete w transvaginal and torsion righ · 13 of 115 slices shown]
[im 1/115]
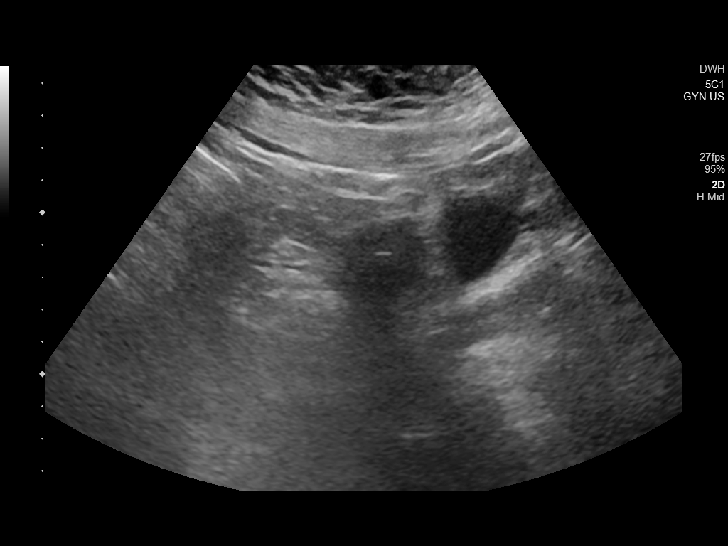
[im 10/115]
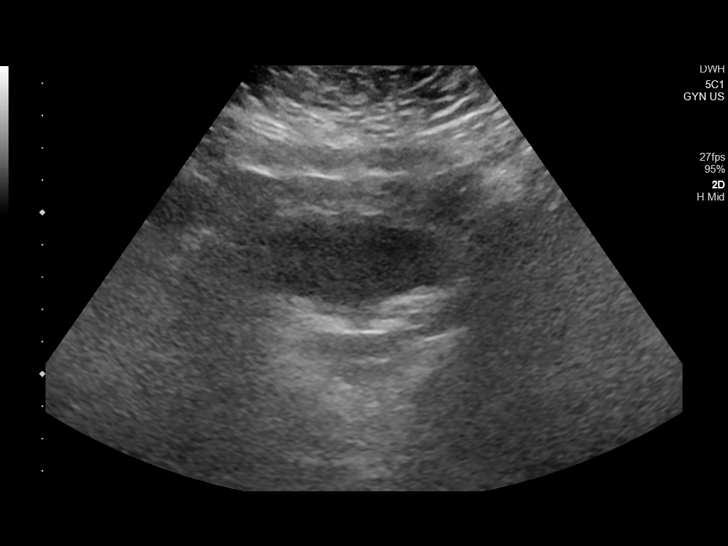
[im 20/115]
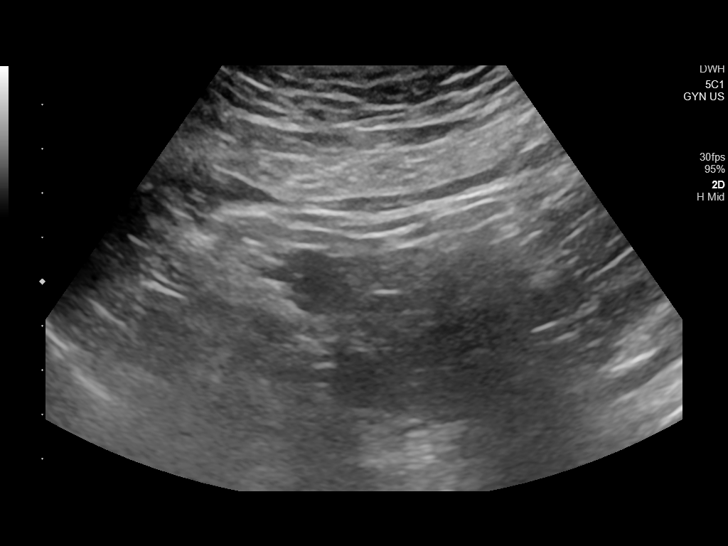
[im 29/115]
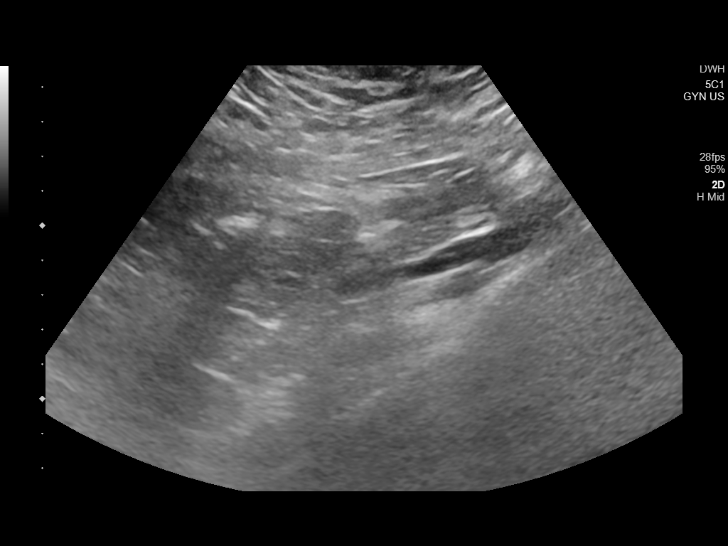
[im 39/115]
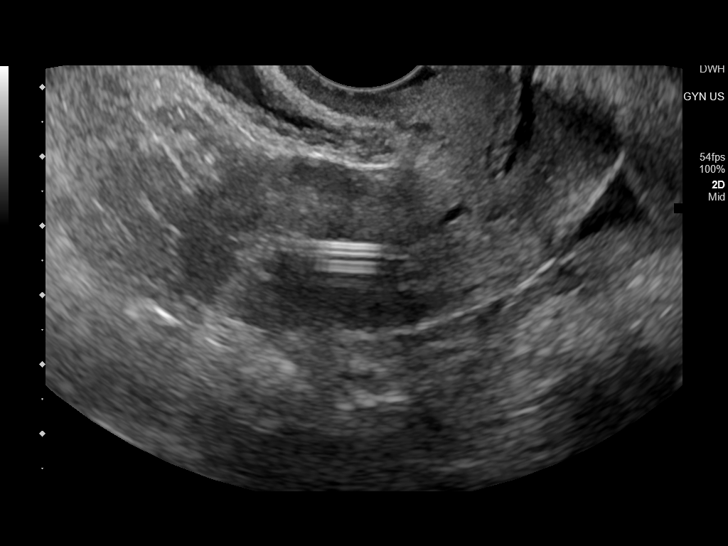
[im 48/115]
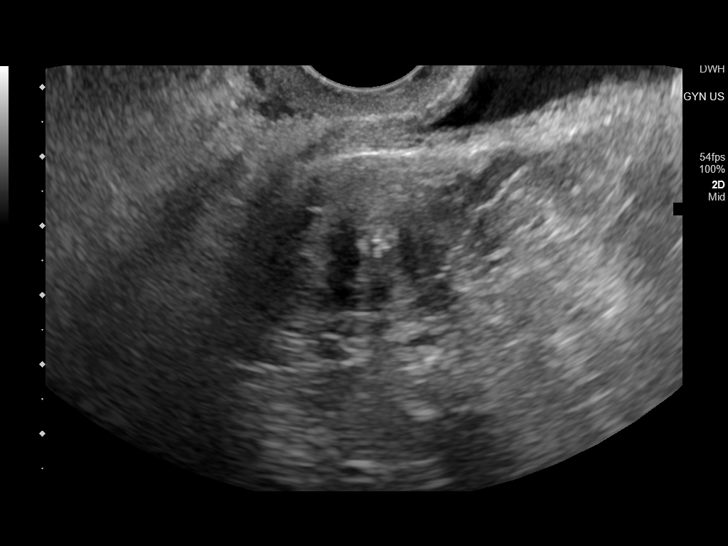
[im 58/115]
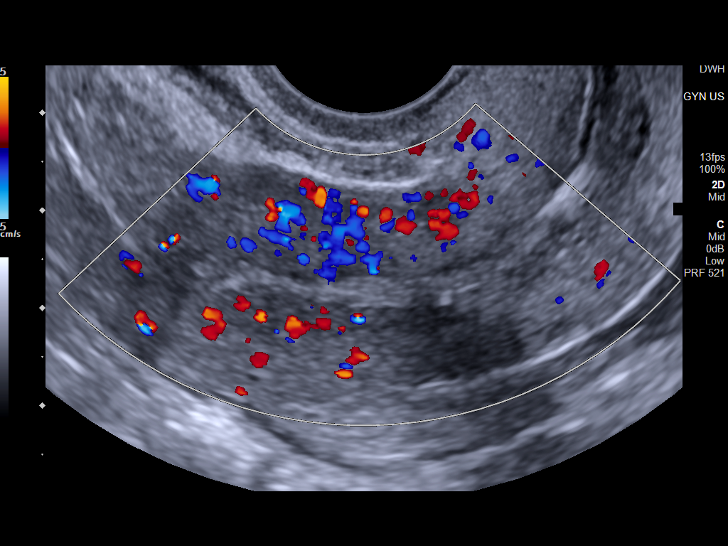
[im 67/115]
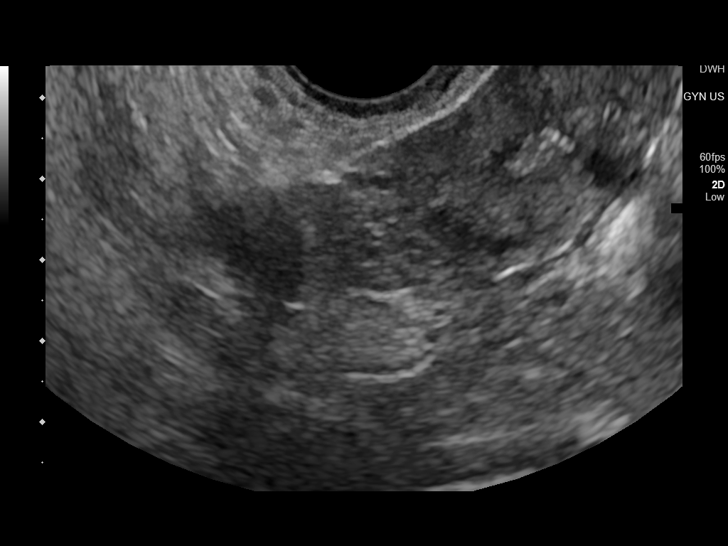
[im 77/115]
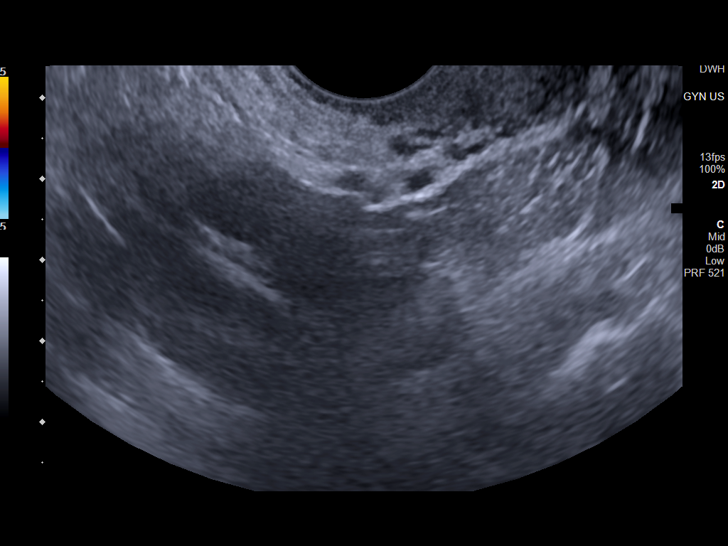
[im 86/115]
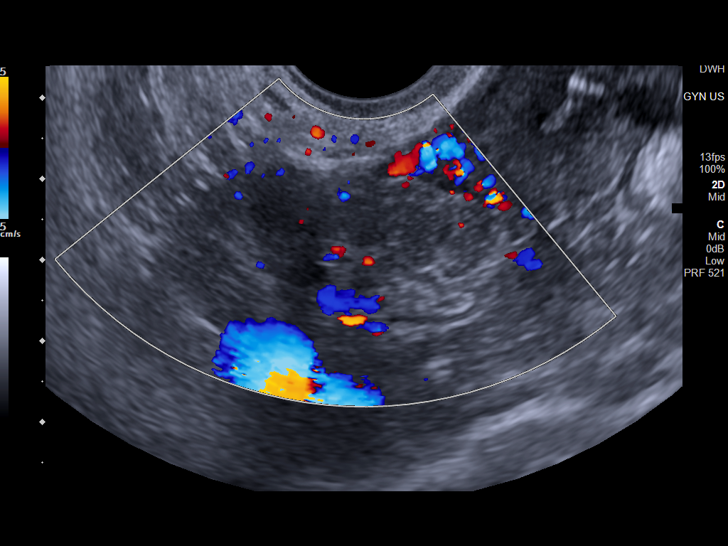
[im 96/115]
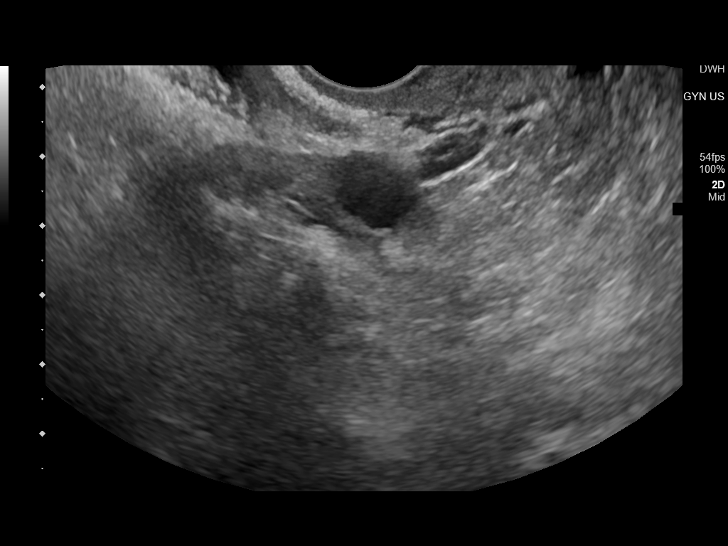
[im 105/115]
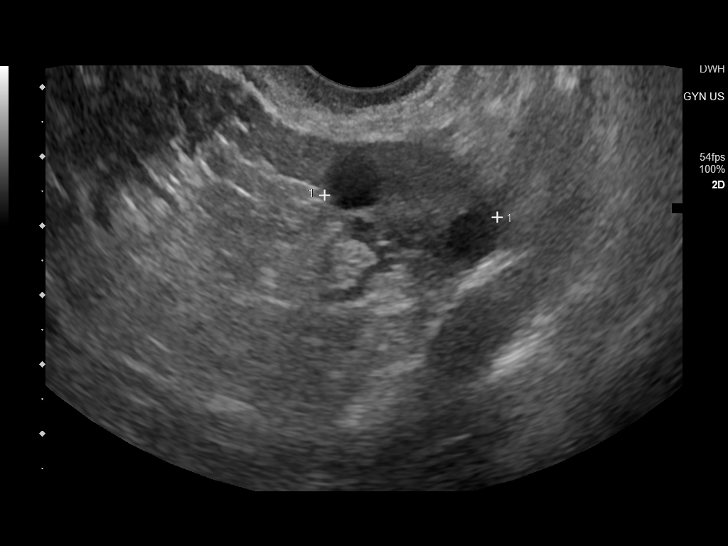
[im 115/115]
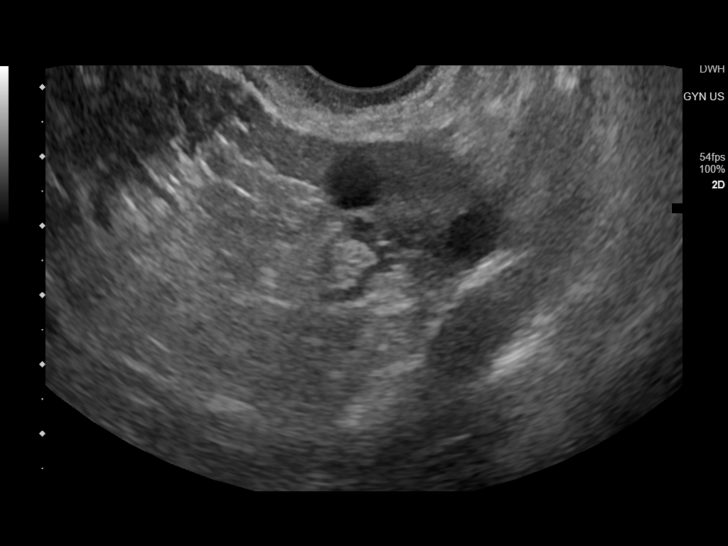

[13 of 25 positions shown; findings below may reference images not displayed]

FINDINGS: Uterus

Measurements: 6.0 x 2.5 x 3.8 cm = volume: 29.4 mL. Uterus is
anteverted. 1.4 x 0.9 x 1.2 cm subserosal fibroid present at the
right uterine body.

Endometrium

Thickness: 3.3 mm. No focal abnormality visualized. IUD in
appropriate position within the medial cavity. Small volume simple
fluid noted within the endocervical canal.

Right ovary

Measurements: 3.1 x 1.6 x 1.7 cm = volume: 2.7 mL. Normal
appearance/no adnexal mass.

Left ovary

Measurements: 4.7 x 2.0 x 2.5 cm = volume: 12.4 mL. 2.3 x 1.6 x
cm complex cyst with internal lace-like architecture, most
characteristic of a benign hemorrhagic cyst.

Pulsed Doppler evaluation of both ovaries demonstrates normal
low-resistance arterial and venous waveforms.

Other findings

Trace free fluid seen within the pelvis.
IMPRESSION: 1. 2.3 cm complex left ovarian cyst, most characteristic of a benign
hemorrhagic cyst.
2. No evidence for ovarian torsion.
3. IUD in appropriate position within the endometrial cavity.
4. 1.4 cm subserosal fibroid at the right uterine body.
5. No other acute abnormality.

## 2023-09-13 ENCOUNTER — Encounter: Payer: Self-pay | Admitting: Podiatry

## 2023-09-13 ENCOUNTER — Ambulatory Visit (INDEPENDENT_AMBULATORY_CARE_PROVIDER_SITE_OTHER)

## 2023-09-13 ENCOUNTER — Ambulatory Visit (INDEPENDENT_AMBULATORY_CARE_PROVIDER_SITE_OTHER): Payer: Self-pay | Admitting: Podiatry

## 2023-09-13 VITALS — Ht 62.0 in | Wt 229.9 lb

## 2023-09-13 DIAGNOSIS — M7752 Other enthesopathy of left foot: Secondary | ICD-10-CM

## 2023-09-13 DIAGNOSIS — M7751 Other enthesopathy of right foot: Secondary | ICD-10-CM

## 2023-09-17 NOTE — Progress Notes (Signed)
   Chief Complaint  Patient presents with   Foot Pain    Pt is here due to bilateral foot pain she states the right is worst then the left that has been going on for about a month, no injury to feet, states a lump form on the top of the right foot this week, which now is a lot smaller then it was when she noticed it.    HPI: 43 y.o. female presenting today for above complaint  Past Medical History:  Diagnosis Date   Mast cell activation syndrome (HCC)    Migraine    Orthostatic hypotension    PCOS (polycystic ovarian syndrome)    Penicillin adverse reaction    POTS (postural orthostatic tachycardia syndrome)    Small fiber neuropathy     No past surgical history on file.  Allergies  Allergen Reactions   Amitriptyline Other (See Comments)    HALLUCINATIONS   Latex    Penicillins Hives     Physical Exam: General: The patient is alert and oriented x3 in no acute distress.  Dermatology: Skin is warm, dry and supple bilateral lower extremities.   Vascular: Palpable pedal pulses bilaterally. Capillary refill within normal limits.  No appreciable edema.  No erythema.  Neurological: Grossly intact via light touch  Musculoskeletal Exam: No pedal deformities noted.  Today there is no specific area of pain and just generalized achiness throughout the foot.  Radiographic Exam B/L feet 09/13/2023:  Normal osseous mineralization. Joint spaces preserved.  No fractures or osseous irregularities noted.  Impression: Negative  Assessment/Plan of Care: 1.  Generalized bilateral foot pain  -Patient evaluated.  X-rays reviewed -Recommend conservative management including good supportive shoes and advised against going barefoot. -Recommend OTC Motrin PRN -Return to clinic PRN       Thresa EMERSON Sar, DPM [facility]  Dr. Thresa EMERSON Sar, DPM    [address] [place], [place] [address]                Office [phone]  Fax  [phone]

## 2024-01-28 ENCOUNTER — Encounter: Payer: Self-pay | Admitting: Family Medicine

## 2024-01-28 ENCOUNTER — Ambulatory Visit: Admitting: Family Medicine

## 2024-01-28 VITALS — BP 122/82 | HR 113 | Ht 62.0 in | Wt 216.0 lb

## 2024-01-28 DIAGNOSIS — Q7962 Hypermobile Ehlers-Danlos syndrome: Secondary | ICD-10-CM

## 2024-01-28 NOTE — Progress Notes (Unsigned)
   I, Leotis Batter, CMA acting as a scribe for Artist Lloyd, MD.  Samantha Baird is a 43 y.o. 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 Hx of fibromyalgia.  MS: Chronic joint pain, Chronic wide spread muscle pain, Joint Hypermobility, Joint Instability, Abnormal spinal curvature, and Slipping Ribs / Costochondritis , lumbar spondylosis Skin/Immune reactions: Hyperextensible/ stretchy Skin, Fragile Skin that tears easily, Soft Velvety Skin, Poor Wound Healing, Easy Bruising / Bleeding, Atrophic Scars, Abnormal Stretch Marks Before Puberty / without significant weight gain , MCAS, Rashes / Hives, and Medication / Chemical / Food Sensitivities  Neurological: Headache  Migraine, Syncope / Pre-Syncope, Reduced Proprioception, Clumsiness , Burning, Numbness and/or Tingling in Extremities, Cognitive Dysfunction / Brain Fog, Sleep Disturbance, and Sensory Sensitivities ANS: Dysautonomia / POTS / Orthostatic Intolerance, Syncope / Pre-Syncope / Dizziness, Fatigue, Raynaud's , Exercise / Temperature Intolerance , and Anxiety / Panic Respiratory: Dyspnea and Chest Tightness CV: Tachycardia and Varicose Veins GI:  GERD, IBS with Diarrhea or Constipation, Painful ABD Bloating, and Frequent N/V Genitourinary: Pelvic Pain / Fullness / Pressure, Incontinence, Recurrent UTI / Cystitis, and Painful Intercourse Hands & Feet: Piezogenic Papules and Raynaud's Phenomenon   Treatments tried: naltrexone, Gabapentin, Pepcid, Benadryl. Has done PT in the past.   Pertinent review of systems: ***  Relevant historical information: ***   Exam:  There were no vitals taken for this visit. General: Well Developed, well nourished, and in no acute distress.   MSK: ***    Lab and Radiology Results No results found for this or any previous visit (from the past 72 hours). No results found.     Assessment and Plan: 43 y.o. female with ***   PDMP  not reviewed this encounter. No orders of the defined types were placed in this encounter.  No orders of the defined types were placed in this encounter.    Discussed warning signs or symptoms. Please see discharge instructions. Patient expresses understanding.   ***

## 2024-01-28 NOTE — Patient Instructions (Addendum)
 Thank you for coming in today.   A referral for physical therapy has been submitted. A representative from the physical therapy office will contact you to coordinate scheduling after confirming your benefits with your insurance provider. If you do not hear from the physical therapy office within the next 1-2 weeks, please let us  know.  Check out the book Disjointed Navigating the Diagnosis and Management of Hypermobile Ehlers-Danlos Syndrome and Hypermobility Spectrum Disorders.    For POTS symptoms: Consume 3 L water and 8-12 gram of salt per day   Check out these websites for exercises to try if you have POTS ([employer] Protocol, [place] Protocol, and [employer] Protocol) [email or web address]-[email or web address]/ https://dean.info/   Guide to Prof. Russek's Patient Self-Care Handouts https://webspace.Http://www.black-smith.org/  Orthostatic Intolerance and Postural Orthostatic Tachycardia Self-Care Checklist https://webspace.Wvlyxc.com  Steps To Managing Your Mast Cell Activation Syndrome [email or web address].Newyearsms.fi.pdf   See you back in 2 months.

## 2024-01-29 DIAGNOSIS — J4520 Mild intermittent asthma, uncomplicated: Secondary | ICD-10-CM | POA: Insufficient documentation

## 2024-01-29 DIAGNOSIS — M797 Fibromyalgia: Secondary | ICD-10-CM | POA: Insufficient documentation

## 2024-01-29 DIAGNOSIS — Q7962 Hypermobile Ehlers-Danlos syndrome: Secondary | ICD-10-CM | POA: Insufficient documentation

## 2024-02-04 ENCOUNTER — Encounter: Payer: Self-pay | Admitting: Family Medicine

## 2024-02-04 DIAGNOSIS — D4709 Other mast cell neoplasms of uncertain behavior: Secondary | ICD-10-CM

## 2024-02-04 DIAGNOSIS — M797 Fibromyalgia: Secondary | ICD-10-CM

## 2024-02-04 DIAGNOSIS — G90A Postural orthostatic tachycardia syndrome (POTS): Secondary | ICD-10-CM

## 2024-02-04 DIAGNOSIS — Q7962 Hypermobile Ehlers-Danlos syndrome: Secondary | ICD-10-CM

## 2024-02-11 ENCOUNTER — Other Ambulatory Visit: Payer: Self-pay

## 2024-03-26 NOTE — Progress Notes (Unsigned)
"       ° °  LILLETTE Ileana Collet, PhD, LAT, ATC acting as a scribe for Artist Lloyd, MD.  Samantha Baird is a 44 y.o. 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 Pt was last seen by Dr. Lloyd on 01/28/24 and was provided educational handouts and referred to PT.  Today, pt reports ***  Pertinent review of systems: ***  Relevant historical information: ***   Exam:  There were no vitals taken for this visit. General: Well Developed, well nourished, and in no acute distress.   MSK: ***    Lab and Radiology Results No results found for this or any previous visit (from the past 72 hours). No results found.     Assessment and Plan: 44 y.o. female with ***   PDMP not reviewed this encounter. No orders of the defined types were placed in this encounter.  No orders of the defined types were placed in this encounter.    Discussed warning signs or symptoms. Please see discharge instructions. Patient expresses understanding.   ***  "

## 2024-03-30 ENCOUNTER — Ambulatory Visit: Admitting: Family Medicine
